# Patient Record
Sex: Female | Born: 1990 | Hispanic: Yes | Marital: Single | Smoking: Never smoker
Health system: Southern US, Community
[De-identification: ages and names within clinical notes are randomized; demographics above are authoritative.]

## PROBLEM LIST (undated history)

## (undated) DIAGNOSIS — G8929 Other chronic pain: Secondary | ICD-10-CM

## (undated) DIAGNOSIS — R1013 Epigastric pain: Secondary | ICD-10-CM

## (undated) HISTORY — PX: THERAPEUTIC ABORTION: SHX798

---

## 2015-10-22 ENCOUNTER — Emergency Department (HOSPITAL_COMMUNITY)
Admission: EM | Admit: 2015-10-22 | Discharge: 2015-10-22 | Disposition: A | Discharge status: Home / Self Care | Payer: BLUE CROSS/BLUE SHIELD | Attending: Emergency Medicine | Admitting: Emergency Medicine

## 2015-10-22 ENCOUNTER — Encounter (HOSPITAL_COMMUNITY): Payer: Self-pay

## 2015-10-22 ENCOUNTER — Emergency Department (HOSPITAL_COMMUNITY)
Admission: EM | Admit: 2015-10-22 | Discharge: 2015-10-22 | Disposition: A | Discharge status: Home / Self Care | Payer: Self-pay

## 2015-10-22 DIAGNOSIS — R197 Diarrhea, unspecified: Secondary | ICD-10-CM | POA: Diagnosis not present

## 2015-10-22 DIAGNOSIS — R1010 Upper abdominal pain, unspecified: Secondary | ICD-10-CM | POA: Insufficient documentation

## 2015-10-22 DIAGNOSIS — G8929 Other chronic pain: Secondary | ICD-10-CM | POA: Insufficient documentation

## 2015-10-22 DIAGNOSIS — R1110 Vomiting, unspecified: Secondary | ICD-10-CM | POA: Insufficient documentation

## 2015-10-22 HISTORY — DX: Epigastric pain: R10.13

## 2015-10-22 HISTORY — DX: Other chronic pain: G89.29

## 2015-10-22 LAB — URINALYSIS, ROUTINE W REFLEX MICROSCOPIC
Bilirubin Urine: NEGATIVE
Glucose, UA: NEGATIVE mg/dL
Hgb urine dipstick: NEGATIVE
Ketones, ur: NEGATIVE mg/dL
Nitrite: POSITIVE — AB
PH: 7.5 (ref 5.0–8.0)
Protein, ur: NEGATIVE mg/dL
SPECIFIC GRAVITY, URINE: 1.017 (ref 1.005–1.030)

## 2015-10-22 LAB — URINE MICROSCOPIC-ADD ON

## 2015-10-22 LAB — COMPREHENSIVE METABOLIC PANEL
ALK PHOS: 48 U/L (ref 38–126)
ALT: 11 U/L — ABNORMAL LOW (ref 14–54)
AST: 18 U/L (ref 15–41)
Albumin: 4.1 g/dL (ref 3.5–5.0)
Anion gap: 12 (ref 5–15)
BUN: <5 mg/dL — ABNORMAL LOW (ref 6–20)
CALCIUM: 9.3 mg/dL (ref 8.9–10.3)
CO2: 19 mmol/L — AB (ref 22–32)
Chloride: 111 mmol/L (ref 101–111)
Creatinine, Ser: 0.58 mg/dL (ref 0.44–1.00)
GFR calc non Af Amer: >60 mL/min (ref >60)
Glucose, Bld: 76 mg/dL (ref 65–99)
POTASSIUM: 3.9 mmol/L (ref 3.5–5.1)
SODIUM: 142 mmol/L (ref 135–145)
Total Bilirubin: 0.3 mg/dL (ref 0.3–1.2)
Total Protein: 7.6 g/dL (ref 6.5–8.1)

## 2015-10-22 LAB — CBC
HCT: 38.7 % (ref 36.0–46.0)
HEMOGLOBIN: 12.5 g/dL (ref 12.0–15.0)
MCH: 27 pg (ref 26.0–34.0)
MCHC: 32.3 g/dL (ref 30.0–36.0)
MCV: 83.6 fL (ref 78.0–100.0)
PLATELETS: 266 10*3/uL (ref 150–400)
RBC: 4.63 MIL/uL (ref 3.87–5.11)
RDW: 14.7 % (ref 11.5–15.5)
WBC: 11.8 10*3/uL — ABNORMAL HIGH (ref 4.0–10.5)

## 2015-10-22 LAB — I-STAT BETA HCG BLOOD, ED (MC, WL, AP ONLY): I-stat hCG, quantitative: <5 m[IU]/mL (ref <5)

## 2015-10-22 LAB — LIPASE, BLOOD: LIPASE: 19 U/L (ref 11–51)

## 2015-10-22 NOTE — ED Notes (Signed)
Patient is no longer in triage.  Unable to find patient.

## 2015-10-22 NOTE — ED Notes (Signed)
Pt was called from the lobby X 3  For room with no answer.

## 2015-10-22 NOTE — ED Notes (Signed)
Onset last night vomiting x 4, last vomit 1 hour PTA.  Pt reports chronic upper mid abd pain, onset last night with vomiting abd pain,  Returned that is constant and staying the same.  Last time pt had abd pain was 2 weeks ago.  Watery diarrhea x 3 today.

## 2017-11-03 DIAGNOSIS — H53002 Unspecified amblyopia, left eye: Secondary | ICD-10-CM | POA: Diagnosis not present

## 2017-11-03 DIAGNOSIS — Q141 Congenital malformation of retina: Secondary | ICD-10-CM | POA: Diagnosis not present

## 2018-08-05 ENCOUNTER — Other Ambulatory Visit: Payer: Self-pay | Admitting: Obstetrics & Gynecology

## 2018-08-05 DIAGNOSIS — N6310 Unspecified lump in the right breast, unspecified quadrant: Secondary | ICD-10-CM

## 2018-08-05 DIAGNOSIS — Z01411 Encounter for gynecological examination (general) (routine) with abnormal findings: Secondary | ICD-10-CM | POA: Diagnosis not present

## 2018-08-05 DIAGNOSIS — N6311 Unspecified lump in the right breast, upper outer quadrant: Secondary | ICD-10-CM | POA: Diagnosis not present

## 2018-08-19 ENCOUNTER — Ambulatory Visit
Admission: RE | Admit: 2018-08-19 | Discharge: 2018-08-19 | Disposition: A | Discharge status: Home / Self Care | Payer: BLUE CROSS/BLUE SHIELD | Source: Ambulatory Visit | Attending: Obstetrics & Gynecology | Admitting: Obstetrics & Gynecology

## 2018-08-19 DIAGNOSIS — N6310 Unspecified lump in the right breast, unspecified quadrant: Secondary | ICD-10-CM

## 2018-08-19 DIAGNOSIS — N6311 Unspecified lump in the right breast, upper outer quadrant: Secondary | ICD-10-CM | POA: Diagnosis not present

## 2019-02-14 IMAGING — US ULTRASOUND RIGHT BREAST LIMITED
1 series · 6 of 6 positions shown · non-contrast
Comparison: Previous exam(s).

CLINICAL DATA: 27-year-old female with a palpable lump in the
upper-outer quadrant of the right breast.

EXAM:
ULTRASOUND OF THE RIGHT BREAST

[Series 1: ultrasound right breast limited · 0.07mm/px · 6 of 6 slices shown]
[im 1/6]
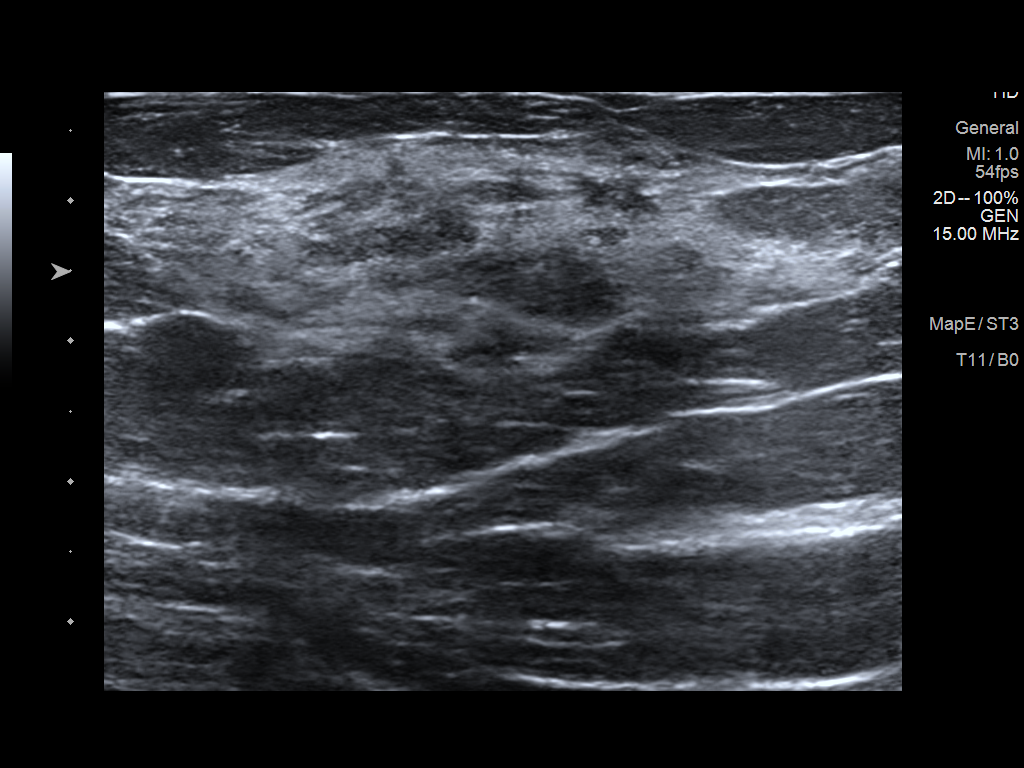
[im 2/6]
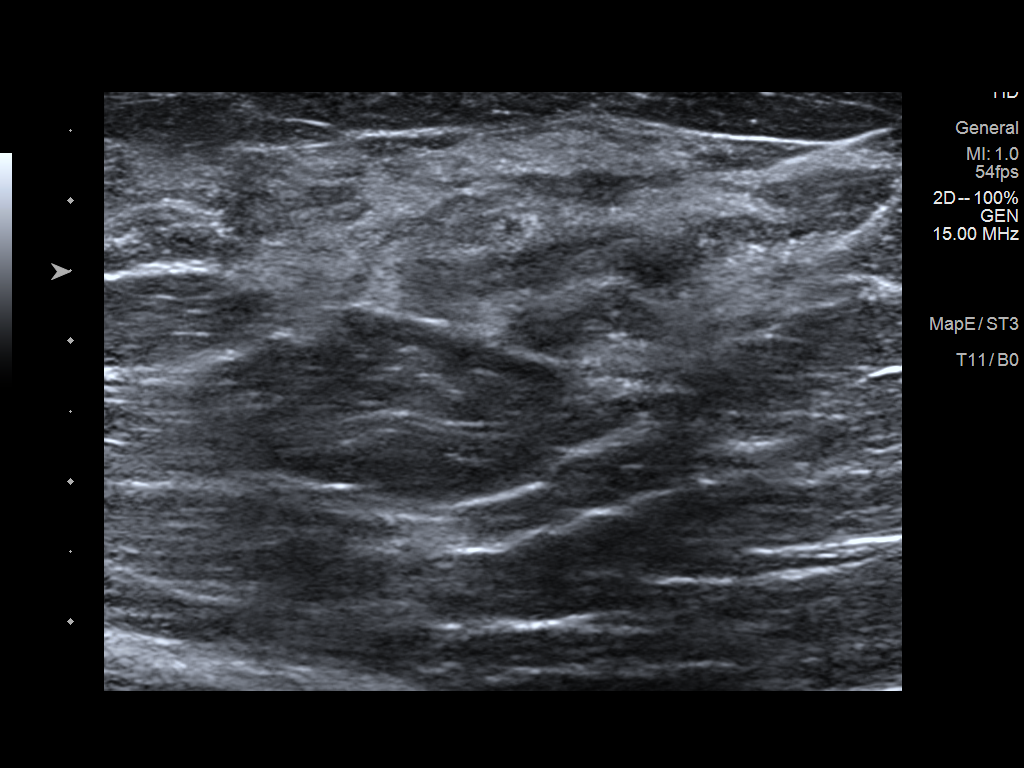
[im 3/6]
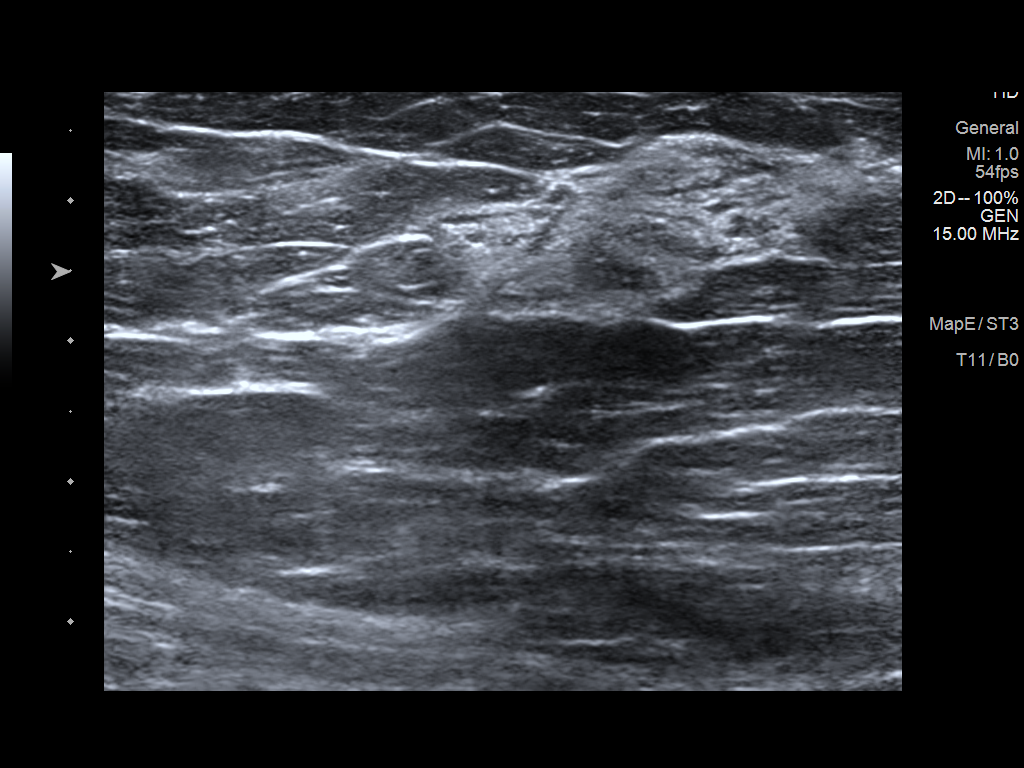
[im 4/6]
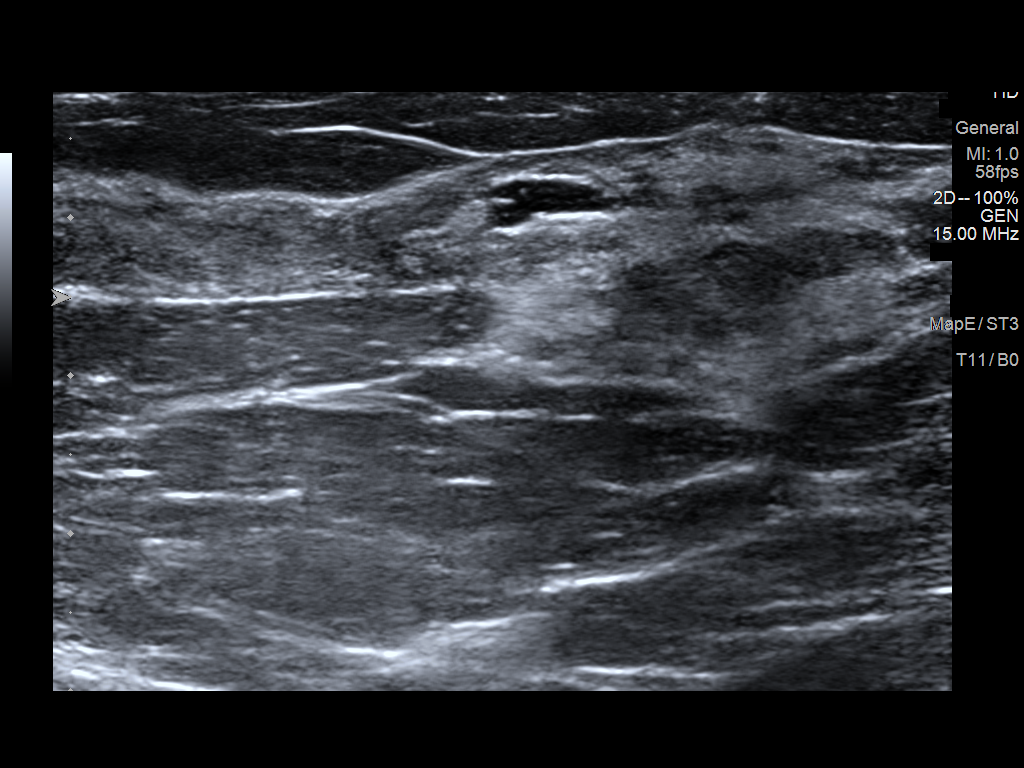
[im 5/6]
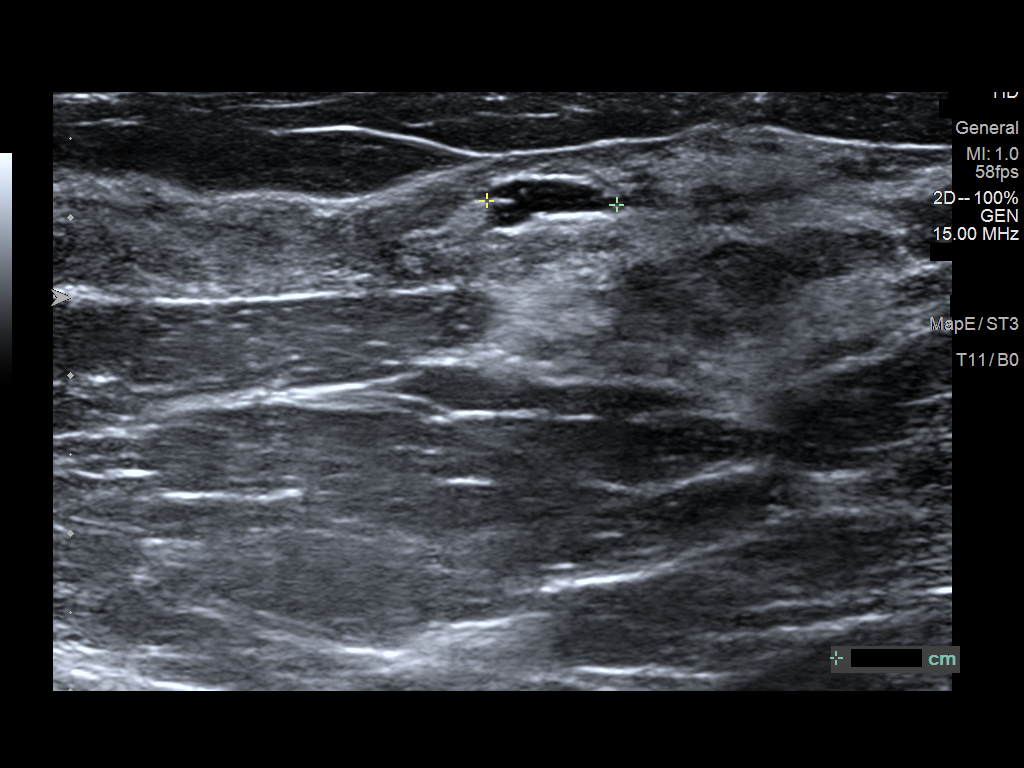
[im 6/6]
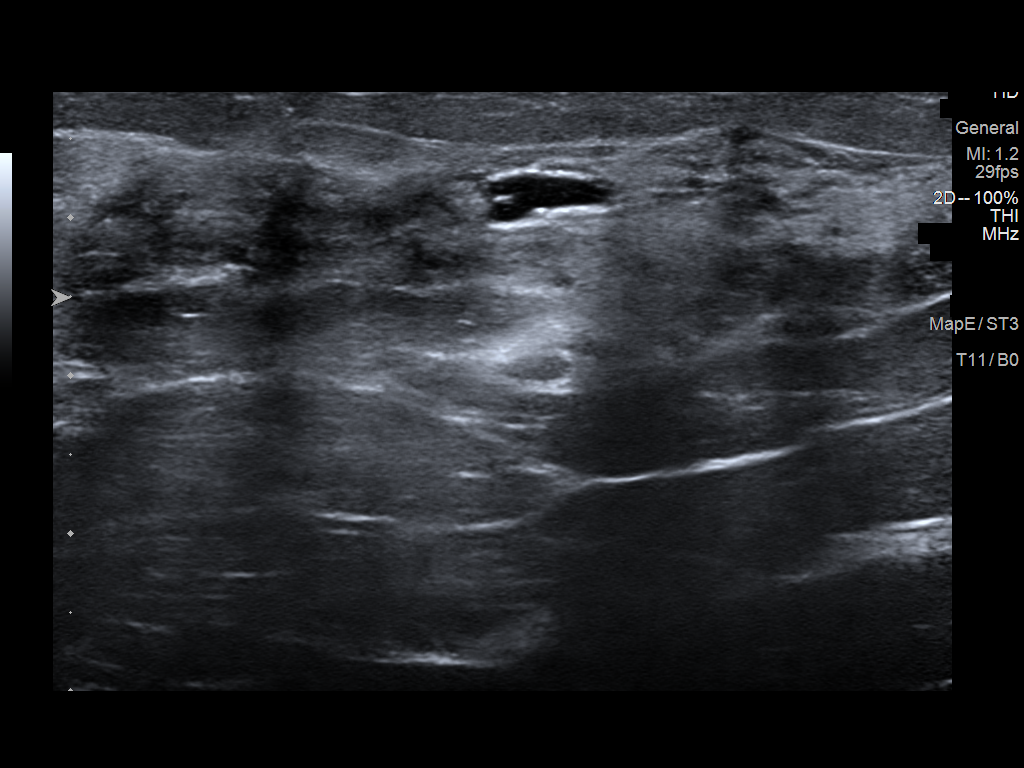

[6 of 6 positions shown; findings below may reference images not displayed]

FINDINGS: On physical exam, I palpate dense fibroglandular tissue without
suspicious lump in the upper outer right breast.

Targeted ultrasound is performed, showing normal fibroglandular
tissue without focal or suspicious sonographic abnormality. An
incidental 8 mm cyst is noted in the vicinity. Evaluation of the
upper outer right breast was performed.
IMPRESSION: No suspicious sonographic findings corresponding with the patient's
palpable right breast lump.

RECOMMENDATION:
1. Clinical follow-up recommended for the palpable area of concern
in the right breast. Any further workup should be based on clinical
grounds.
2. Annual screening mammography beginning at age 40 or sooner if
there are persistent or intervening clinical concerns.

I have discussed the findings and recommendations with the patient.
Results were also provided in writing at the conclusion of the
visit. If applicable, a reminder letter will be sent to the patient
regarding the next appointment.

BI-RADS CATEGORY  2: Benign.

## 2022-06-11 ENCOUNTER — Other Ambulatory Visit: Payer: Self-pay | Admitting: Gastroenterology

## 2022-06-11 ENCOUNTER — Encounter: Payer: Self-pay | Admitting: Gastroenterology

## 2022-06-11 DIAGNOSIS — R1013 Epigastric pain: Secondary | ICD-10-CM

## 2022-06-11 DIAGNOSIS — R112 Nausea with vomiting, unspecified: Secondary | ICD-10-CM

## 2022-06-18 ENCOUNTER — Ambulatory Visit
Admission: RE | Admit: 2022-06-18 | Discharge: 2022-06-18 | Disposition: A | Discharge status: Home / Self Care | Payer: PRIVATE HEALTH INSURANCE | Source: Ambulatory Visit | Attending: Gastroenterology | Admitting: Gastroenterology

## 2022-06-18 DIAGNOSIS — R112 Nausea with vomiting, unspecified: Secondary | ICD-10-CM

## 2022-06-18 DIAGNOSIS — R1013 Epigastric pain: Secondary | ICD-10-CM
